# Patient Record
Sex: Female | Born: 1970 | Race: White | Hispanic: No | Marital: Married
Health system: Southern US, Community
[De-identification: ages and names within clinical notes are randomized; demographics above are authoritative.]

---

## 2007-03-11 ENCOUNTER — Encounter: Payer: Self-pay | Admitting: Obstetrics & Gynecology

## 2007-03-11 ENCOUNTER — Ambulatory Visit: Payer: Self-pay | Admitting: Gynecology

## 2007-06-24 ENCOUNTER — Ambulatory Visit: Payer: Self-pay | Admitting: Obstetrics & Gynecology

## 2008-01-13 ENCOUNTER — Ambulatory Visit: Payer: Self-pay | Admitting: Obstetrics & Gynecology

## 2008-04-01 ENCOUNTER — Ambulatory Visit: Payer: Self-pay | Admitting: Obstetrics and Gynecology

## 2008-04-30 ENCOUNTER — Emergency Department: Payer: Self-pay | Admitting: Emergency Medicine

## 2008-06-08 ENCOUNTER — Ambulatory Visit: Payer: Self-pay | Admitting: Obstetrics and Gynecology

## 2008-06-08 ENCOUNTER — Encounter: Payer: Self-pay | Admitting: Family Medicine

## 2008-06-08 LAB — CONVERTED CEMR LAB
Rh Type: POSITIVE
hCG, Beta Chain, Quant, S: <2 milliintl units/mL

## 2008-07-16 ENCOUNTER — Encounter: Payer: Self-pay | Admitting: Family Medicine

## 2008-07-16 ENCOUNTER — Ambulatory Visit: Payer: Self-pay | Admitting: Obstetrics and Gynecology

## 2008-07-16 LAB — CONVERTED CEMR LAB
Basophils Absolute: 0 10*3/uL (ref 0.0–0.1)
Basophils Relative: 0 % (ref 0–1)
Hepatitis B Surface Ag: NEGATIVE
Lymphocytes Relative: 26 % (ref 12–46)
MCHC: 33.3 g/dL (ref 30.0–36.0)
Neutro Abs: 5.8 10*3/uL (ref 1.7–7.7)
Neutrophils Relative %: 63 % (ref 43–77)
RBC: 4.35 M/uL (ref 3.87–5.11)
RDW: 13.6 % (ref 11.5–15.5)
Rubella: 16.9 intl units/mL — ABNORMAL HIGH

## 2008-07-27 ENCOUNTER — Ambulatory Visit: Payer: Self-pay | Admitting: Obstetrics & Gynecology

## 2008-07-27 ENCOUNTER — Encounter: Payer: Self-pay | Admitting: Obstetrics & Gynecology

## 2008-08-18 ENCOUNTER — Ambulatory Visit: Payer: Self-pay | Admitting: Obstetrics and Gynecology

## 2008-08-25 ENCOUNTER — Ambulatory Visit (HOSPITAL_COMMUNITY): Admission: RE | Admit: 2008-08-25 | Discharge: 2008-08-25 | Payer: Self-pay | Admitting: Family Medicine

## 2008-09-14 ENCOUNTER — Ambulatory Visit: Payer: Self-pay | Admitting: Obstetrics & Gynecology

## 2008-09-22 ENCOUNTER — Ambulatory Visit (HOSPITAL_COMMUNITY): Admission: RE | Admit: 2008-09-22 | Discharge: 2008-09-22 | Payer: Self-pay | Admitting: Family Medicine

## 2008-10-12 ENCOUNTER — Ambulatory Visit (HOSPITAL_COMMUNITY): Admission: RE | Admit: 2008-10-12 | Discharge: 2008-10-12 | Payer: Self-pay | Admitting: Obstetrics & Gynecology

## 2008-10-13 ENCOUNTER — Ambulatory Visit: Payer: Self-pay | Admitting: Obstetrics & Gynecology

## 2008-10-14 ENCOUNTER — Encounter: Payer: Self-pay | Admitting: Family Medicine

## 2008-11-09 ENCOUNTER — Ambulatory Visit: Payer: Self-pay | Admitting: Obstetrics & Gynecology

## 2008-11-24 ENCOUNTER — Ambulatory Visit: Payer: Self-pay | Admitting: Obstetrics & Gynecology

## 2008-12-15 ENCOUNTER — Ambulatory Visit: Payer: Self-pay | Admitting: Obstetrics & Gynecology

## 2008-12-15 ENCOUNTER — Encounter: Payer: Self-pay | Admitting: Family Medicine

## 2008-12-15 LAB — CONVERTED CEMR LAB
HCT: 34.7 % — ABNORMAL LOW (ref 36.0–46.0)
Hemoglobin: 11.9 g/dL — ABNORMAL LOW (ref 12.0–15.0)
MCHC: 34.3 g/dL (ref 30.0–36.0)
MCV: 92 fL (ref 78.0–100.0)
RDW: 14.4 % (ref 11.5–15.5)

## 2009-01-05 ENCOUNTER — Ambulatory Visit: Payer: Self-pay | Admitting: Obstetrics & Gynecology

## 2009-01-25 ENCOUNTER — Ambulatory Visit: Payer: Self-pay | Admitting: Obstetrics & Gynecology

## 2009-02-11 ENCOUNTER — Ambulatory Visit: Payer: Self-pay | Admitting: Obstetrics and Gynecology

## 2009-02-11 ENCOUNTER — Encounter: Payer: Self-pay | Admitting: Family Medicine

## 2009-02-11 LAB — CONVERTED CEMR LAB: Chlamydia, DNA Probe: NEGATIVE

## 2009-02-23 ENCOUNTER — Ambulatory Visit: Payer: Self-pay | Admitting: Obstetrics & Gynecology

## 2009-03-01 ENCOUNTER — Ambulatory Visit: Payer: Self-pay | Admitting: Family Medicine

## 2009-03-07 ENCOUNTER — Ambulatory Visit: Payer: Self-pay | Admitting: Family Medicine

## 2009-03-07 ENCOUNTER — Inpatient Hospital Stay (HOSPITAL_COMMUNITY): Admission: AD | Admit: 2009-03-07 | Discharge: 2009-03-08 | Payer: Self-pay | Admitting: Family Medicine

## 2009-04-15 ENCOUNTER — Ambulatory Visit: Payer: Self-pay | Admitting: Obstetrics & Gynecology

## 2009-04-20 ENCOUNTER — Ambulatory Visit: Payer: Self-pay | Admitting: Obstetrics & Gynecology

## 2009-12-07 ENCOUNTER — Ambulatory Visit: Payer: Self-pay | Admitting: Obstetrics & Gynecology

## 2010-10-07 LAB — CBC
HCT: 34.5 % — ABNORMAL LOW (ref 36.0–46.0)
HCT: 40.3 % (ref 36.0–46.0)
Hemoglobin: 13.5 g/dL (ref 12.0–15.0)
MCHC: 33.5 g/dL (ref 30.0–36.0)
MCHC: 34 g/dL (ref 30.0–36.0)
MCV: 93.9 fL (ref 78.0–100.0)
MCV: 94.8 fL (ref 78.0–100.0)
Platelets: 189 10*3/uL (ref 150–400)
RBC: 4.3 MIL/uL (ref 3.87–5.11)
WBC: 10.4 10*3/uL (ref 4.0–10.5)

## 2010-11-15 NOTE — Assessment & Plan Note (Signed)
NAMEJANISSA, BERTRAM NO.:  [id number]   MEDICAL RECORD NO.:  [id number]          PATIENT TYPE:  POB   LOCATION:  [facility] at [facility]         FACILITY:  [facility]   PHYSICIAN:  Allie Bossier, MD        DATE OF BIRTH:  04-04-1971   DATE OF SERVICE:  12/07/2009                                  CLINIC NOTE   Ms. Frankland is a 40 year old married white, G5, P4, A1, who comes in  because she recently discovered what she thinks is a skin tag in her  right vulvar area about a month ago.  She denies any other vulvar  complaints.  She does tell me that since the delivery of one of her  children about 7 years ago, she has been having genuine stress urinary  incontinence.  She says she frequently has accidents for refuses to wear  pads.  She would like to have this surgically repaired.   On exam, she has what appears to be a small skin tag on her right vulvar  area as she has positive Q-tip test.  I have prepped the vulva with  Betadine and injected about 1 mL of 1% lidocaine.  I then excised the  skin tag and cauterized the defect with silver nitrate.  She tolerated  that procedure well.  I am turning in the paperwork to schedule her for  an advantage fit mid urethral sling to be scheduled as soon as possible  (probably early August).  She understands that she might have to wear a  catheter for several days after the surgery or to instance that she  might have to go home with catheter after the surgery, and she wishes to  proceed this.  I will schedule her mammogram as soon as possible and her  annual exam as soon as possible.      Allie Bossier, MD     MCD/MEDQ  D:  12/07/2009  T:  12/08/2009  Job:  [id number]

## 2010-11-15 NOTE — Assessment & Plan Note (Signed)
NAME:  Shelley Frye, Shelley Frye NO.:  [id number]   MEDICAL RECORD NO.:  [id number]          PATIENT TYPE:  POB   LOCATION:  [facility] at [facility]         FACILITY:  [facility]   PHYSICIAN:  Allie Bossier, MD        DATE OF BIRTH:  09-05-1970   DATE OF SERVICE:  01/13/2008                                  CLINIC NOTE   Shelley Frye is a 40 year old, newly married, gravida 4, para 3, abortus 1, who  comes here for followup of her depression medicine.  When I saw her  March 11, 2007, she was complaining of wanting to quit smoking as  well as being anxious and mean.  I gave her prescription for  Wellbutrin 150 mg a day.  She followed up in approximately 3 months and  at that time she reports that she had quit smoking, but that she was  still mean and she was wishing to try another class of medications.  I  gave her prescription for subgeneric Celexa 20 mg at night.  She reports  that this has done very well for her.  She feels good.  Her only  complaint is that of a 10 pounds weight gain over the last 7 months.  She says she has had an increased appetite.  I did do a TSH on March 11, 2007, and it was normal.  The second issue she wants to discuss is  that now that she is newly married, she and her partner (of the last 5  years) wanting to have a pregnancy.  She is aware of the genetic risks  at this age and wishes to proceed.  She is going to begin folic  acid/prenatal vitamins/multivitamin now and in approximately 2 month if  she still desires to have her pregnancy, we will remove her IUD.  I have  also decreased her Celexa to 10 mg a day to see if this will change her  appetite.  I have also encouraged her to exercise more.      Allie Bossier, MD     MCD/MEDQ  D:  01/13/2008  T:  01/14/2008  Job:  [id number]

## 2011-03-09 IMAGING — US US AMNIOCENTESIS
1 series · 3 of 3 positions shown · non-contrast
Comparison: none

OBSTETRICAL ULTRASOUND:
 This ultrasound was performed in The [HOSPITAL], and the [facility] report will be stored to [REDACTED] PACS.

[Series 1: us amniocentesis · 3 of 3 slices shown]
[im 1/3]
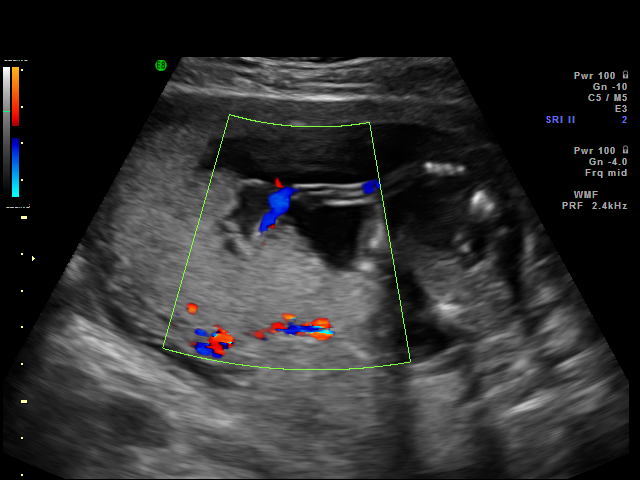
[im 2/3]
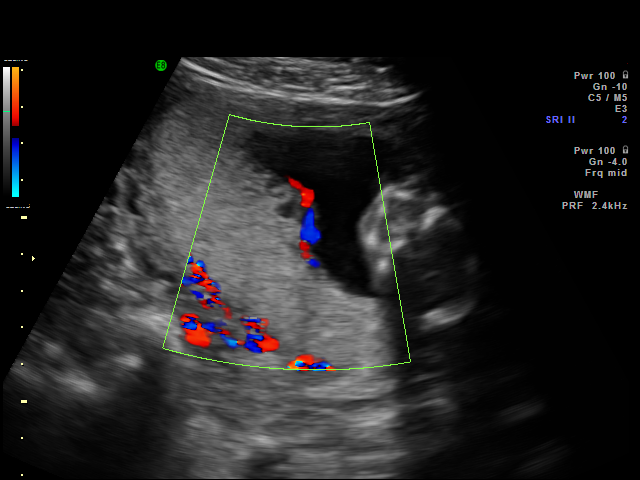
[im 3/3]
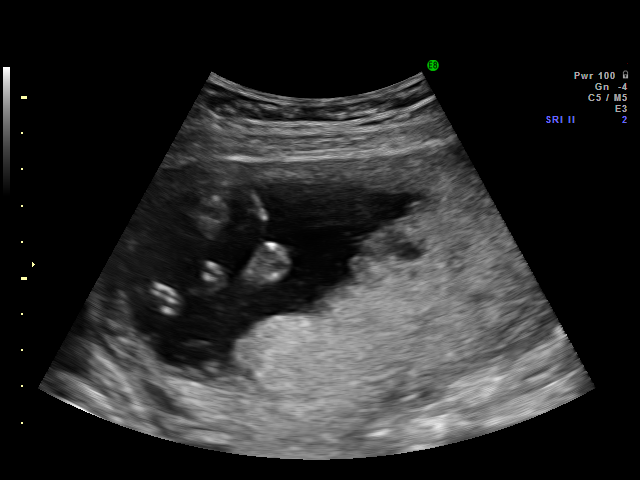

[3 of 3 positions shown; findings below may reference images not displayed]

IMPRESSION: [facility] has also been faxed to the ordering physician.

## 2019-08-30 ENCOUNTER — Ambulatory Visit: Payer: Self-pay | Attending: Internal Medicine

## 2019-08-30 DIAGNOSIS — Z23 Encounter for immunization: Secondary | ICD-10-CM | POA: Insufficient documentation

## 2019-08-30 NOTE — Progress Notes (Signed)
   Covid-19 Vaccination Clinic  Name:  Shelley Frye    MRN: [id number] DOB: 1970/07/28  08/30/2019  [name] was observed post Covid-19 immunization for 15 minutes without incidence. She was provided with Vaccine Information Sheet and instruction to access the V-Safe system.   [name] was instructed to call 911 with any severe reactions post vaccine: Marland Kitchen Difficulty breathing  . Swelling of your face and throat  . A fast heartbeat  . A bad rash all over your body  . Dizziness and weakness    Immunizations Administered    Name Date Dose VIS Date Route   Moderna COVID-19 Vaccine 08/30/2019  4:38 PM 0.5 mL 06/03/2019 Intramuscular   Manufacturer: Moderna   Lot: [id number]   NDC: 96116-435-39

## 2019-09-27 ENCOUNTER — Ambulatory Visit: Payer: Self-pay

## 2019-09-30 ENCOUNTER — Ambulatory Visit: Payer: Self-pay

## 2022-05-11 LAB — COLOGUARD

## 2023-12-28 ENCOUNTER — Ambulatory Visit: Payer: Self-pay

## 2023-12-28 DIAGNOSIS — Z1211 Encounter for screening for malignant neoplasm of colon: Secondary | ICD-10-CM | POA: Diagnosis present
